# Patient Record
Sex: Male | Born: 1990 | Race: Black or African American | Hispanic: No | Marital: Single | Smoking: Current some day smoker
Health system: Southern US, Community
[De-identification: ages and names within clinical notes are randomized; demographics above are authoritative.]

---
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
## 2008-04-08 ENCOUNTER — Emergency Department (HOSPITAL_COMMUNITY): Admission: EM | Admit: 2008-04-08 | Discharge: 2008-04-08 | Payer: Self-pay | Admitting: Emergency Medicine

## 2010-05-23 ENCOUNTER — Emergency Department (HOSPITAL_BASED_OUTPATIENT_CLINIC_OR_DEPARTMENT_OTHER)
Admission: EM | Admit: 2010-05-23 | Discharge: 2010-05-23 | Payer: Self-pay | Source: Home / Self Care | Admitting: Emergency Medicine

## 2010-05-24 LAB — POCT TOXICOLOGY PANEL: Tetrahydrocannabinol: POSITIVE

## 2010-06-17 ENCOUNTER — Emergency Department (HOSPITAL_COMMUNITY): Payer: Self-pay

## 2010-06-17 ENCOUNTER — Emergency Department (HOSPITAL_COMMUNITY)
Admission: EM | Admit: 2010-06-17 | Discharge: 2010-06-17 | Disposition: A | Discharge status: Home / Self Care | Payer: Self-pay | Attending: Emergency Medicine | Admitting: Emergency Medicine

## 2010-06-17 DIAGNOSIS — R51 Headache: Secondary | ICD-10-CM | POA: Insufficient documentation

## 2010-06-17 LAB — CK TOTAL AND CKMB (NOT AT ARMC)
CK, MB: 0.5 ng/mL (ref 0.3–4.0)
Relative Index: 0.3 (ref 0.0–2.5)
Total CK: 168 U/L (ref 7–232)

## 2010-06-17 LAB — CBC
HCT: 37 % — ABNORMAL LOW (ref 39.0–52.0)
Hemoglobin: 13 g/dL (ref 13.0–17.0)
MCH: 32.2 pg (ref 26.0–34.0)
MCHC: 35.1 g/dL (ref 30.0–36.0)
MCV: 91.6 fL (ref 78.0–100.0)
Platelets: 159 10*3/uL (ref 150–400)
RBC: 4.04 MIL/uL — ABNORMAL LOW (ref 4.22–5.81)
RDW: 12.6 % (ref 11.5–15.5)
WBC: 8.5 10*3/uL (ref 4.0–10.5)

## 2010-06-17 LAB — COMPREHENSIVE METABOLIC PANEL
ALT: 10 U/L (ref 0–53)
AST: 16 U/L (ref 0–37)
Albumin: 4.2 g/dL (ref 3.5–5.2)
Alkaline Phosphatase: 69 U/L (ref 39–117)
BUN: 11 mg/dL (ref 6–23)
CO2: 27 mEq/L (ref 19–32)
Calcium: 9.5 mg/dL (ref 8.4–10.5)
Chloride: 106 mEq/L (ref 96–112)
Creatinine, Ser: 0.89 mg/dL (ref 0.4–1.5)
GFR calc Af Amer: >60 mL/min (ref >60)
GFR calc non Af Amer: >60 mL/min (ref >60)
Glucose, Bld: 106 mg/dL — ABNORMAL HIGH (ref 70–99)
Potassium: 3.8 mEq/L (ref 3.5–5.1)
Sodium: 139 mEq/L (ref 135–145)
Total Bilirubin: 0.5 mg/dL (ref 0.3–1.2)
Total Protein: 7.4 g/dL (ref 6.0–8.3)

## 2010-06-17 LAB — DIFFERENTIAL
Basophils Absolute: 0 10*3/uL (ref 0.0–0.1)
Basophils Relative: 0 % (ref 0–1)
Eosinophils Absolute: 0 10*3/uL (ref 0.0–0.7)
Eosinophils Relative: 0 % (ref 0–5)
Lymphocytes Relative: 25 % (ref 12–46)
Lymphs Abs: 2.1 10*3/uL (ref 0.7–4.0)
Monocytes Absolute: 0.7 10*3/uL (ref 0.1–1.0)
Monocytes Relative: 8 % (ref 3–12)
Neutro Abs: 5.7 10*3/uL (ref 1.7–7.7)
Neutrophils Relative %: 67 % (ref 43–77)

## 2010-07-21 ENCOUNTER — Emergency Department (HOSPITAL_COMMUNITY)
Admission: EM | Admit: 2010-07-21 | Discharge: 2010-07-21 | Disposition: A | Discharge status: Home / Self Care | Payer: Self-pay | Attending: Emergency Medicine | Admitting: Emergency Medicine

## 2010-07-21 DIAGNOSIS — F29 Unspecified psychosis not due to a substance or known physiological condition: Secondary | ICD-10-CM | POA: Insufficient documentation

## 2010-07-21 LAB — COMPREHENSIVE METABOLIC PANEL
ALT: 17 U/L (ref 0–53)
AST: 20 U/L (ref 0–37)
Albumin: 3.9 g/dL (ref 3.5–5.2)
Alkaline Phosphatase: 55 U/L (ref 39–117)
BUN: 9 mg/dL (ref 6–23)
CO2: 26 mEq/L (ref 19–32)
Calcium: 9.3 mg/dL (ref 8.4–10.5)
Chloride: 106 mEq/L (ref 96–112)
Creatinine, Ser: 0.97 mg/dL (ref 0.4–1.5)
GFR calc Af Amer: >60 mL/min (ref >60)
GFR calc non Af Amer: >60 mL/min (ref >60)
Glucose, Bld: 114 mg/dL — ABNORMAL HIGH (ref 70–99)
Potassium: 3.4 mEq/L — ABNORMAL LOW (ref 3.5–5.1)
Sodium: 137 mEq/L (ref 135–145)
Total Bilirubin: 0.7 mg/dL (ref 0.3–1.2)
Total Protein: 7 g/dL (ref 6.0–8.3)

## 2010-07-21 LAB — DIFFERENTIAL
Basophils Absolute: 0 10*3/uL (ref 0.0–0.1)
Basophils Relative: 0 % (ref 0–1)
Eosinophils Absolute: 0 10*3/uL (ref 0.0–0.7)
Eosinophils Relative: 0 % (ref 0–5)
Lymphocytes Relative: 27 % (ref 12–46)
Lymphs Abs: 2 10*3/uL (ref 0.7–4.0)
Monocytes Absolute: 0.4 10*3/uL (ref 0.1–1.0)
Monocytes Relative: 5 % (ref 3–12)
Neutro Abs: 5.1 10*3/uL (ref 1.7–7.7)
Neutrophils Relative %: 68 % (ref 43–77)

## 2010-07-21 LAB — CBC
HCT: 36.9 % — ABNORMAL LOW (ref 39.0–52.0)
Hemoglobin: 12.5 g/dL — ABNORMAL LOW (ref 13.0–17.0)
MCH: 31.2 pg (ref 26.0–34.0)
MCHC: 33.9 g/dL (ref 30.0–36.0)
MCV: 92 fL (ref 78.0–100.0)
Platelets: 169 10*3/uL (ref 150–400)
RBC: 4.01 MIL/uL — ABNORMAL LOW (ref 4.22–5.81)
RDW: 13.1 % (ref 11.5–15.5)
WBC: 7.5 10*3/uL (ref 4.0–10.5)

## 2010-07-21 LAB — ACETAMINOPHEN LEVEL: Acetaminophen (Tylenol), Serum: <10 ug/mL — ABNORMAL LOW (ref 10–30)

## 2010-07-21 LAB — RAPID URINE DRUG SCREEN, HOSP PERFORMED
Amphetamines: NOT DETECTED
Barbiturates: NOT DETECTED
Benzodiazepines: NOT DETECTED
Cocaine: NOT DETECTED
Opiates: NOT DETECTED
Tetrahydrocannabinol: NOT DETECTED

## 2010-07-21 LAB — ETHANOL: Alcohol, Ethyl (B): <5 mg/dL (ref 0–10)

## 2010-09-26 ENCOUNTER — Emergency Department (HOSPITAL_COMMUNITY): Payer: Self-pay

## 2010-09-26 ENCOUNTER — Emergency Department (HOSPITAL_COMMUNITY)
Admission: EM | Admit: 2010-09-26 | Discharge: 2010-09-26 | Disposition: A | Discharge status: Home / Self Care | Payer: Self-pay | Attending: Emergency Medicine | Admitting: Emergency Medicine

## 2010-09-26 DIAGNOSIS — S0003XA Contusion of scalp, initial encounter: Secondary | ICD-10-CM | POA: Insufficient documentation

## 2010-09-26 DIAGNOSIS — H5789 Other specified disorders of eye and adnexa: Secondary | ICD-10-CM | POA: Insufficient documentation

## 2010-09-26 DIAGNOSIS — S0083XA Contusion of other part of head, initial encounter: Secondary | ICD-10-CM | POA: Insufficient documentation

## 2011-02-11 LAB — URINALYSIS, ROUTINE W REFLEX MICROSCOPIC
Bilirubin Urine: NEGATIVE
Glucose, UA: NEGATIVE mg/dL
Hgb urine dipstick: NEGATIVE
Ketones, ur: NEGATIVE mg/dL
Nitrite: NEGATIVE
Protein, ur: NEGATIVE mg/dL
Specific Gravity, Urine: 1.023 (ref 1.005–1.030)
Urobilinogen, UA: 1 mg/dL (ref 0.0–1.0)
pH: 6.5 (ref 5.0–8.0)

## 2011-02-11 LAB — POCT I-STAT, CHEM 8
BUN: 20 mg/dL (ref 6–23)
Calcium, Ion: 1.22 mmol/L (ref 1.12–1.32)
Chloride: 104 mEq/L (ref 96–112)
Creatinine, Ser: 1.2 mg/dL (ref 0.4–1.5)
Glucose, Bld: 98 mg/dL (ref 70–99)
HCT: 41 % (ref 39.0–52.0)
Hemoglobin: 13.9 g/dL (ref 13.0–17.0)
Potassium: 4.5 mEq/L (ref 3.5–5.1)
Sodium: 141 mEq/L (ref 135–145)
TCO2: 31 mmol/L (ref 0–100)

## 2011-02-11 LAB — DIFFERENTIAL
Basophils Absolute: 0 10*3/uL (ref 0.0–0.1)
Basophils Relative: 1 % (ref 0–1)
Eosinophils Absolute: 0.1 10*3/uL (ref 0.0–0.7)
Eosinophils Relative: 2 % (ref 0–5)
Lymphocytes Relative: 33 % (ref 12–46)
Lymphs Abs: 1.8 10*3/uL (ref 0.7–4.0)
Monocytes Absolute: 0.4 10*3/uL (ref 0.1–1.0)
Monocytes Relative: 7 % (ref 3–12)
Neutro Abs: 3.3 10*3/uL (ref 1.7–7.7)
Neutrophils Relative %: 59 % (ref 43–77)

## 2011-02-11 LAB — POCT CARDIAC MARKERS
CKMB, poc: <1 ng/mL — ABNORMAL LOW (ref 1.0–8.0)
Myoglobin, poc: 56.3 ng/mL (ref 12–200)
Troponin i, poc: <0.05 ng/mL (ref 0.00–0.09)

## 2011-02-11 LAB — CBC
HCT: 39.6 % (ref 39.0–52.0)
Hemoglobin: 13.3 g/dL (ref 13.0–17.0)
MCHC: 33.7 g/dL (ref 30.0–36.0)
MCV: 94.5 fL (ref 78.0–100.0)
Platelets: 158 10*3/uL (ref 150–400)
RBC: 4.19 MIL/uL — ABNORMAL LOW (ref 4.22–5.81)
RDW: 13.4 % (ref 11.5–15.5)
WBC: 5.7 10*3/uL (ref 4.0–10.5)

## 2011-02-11 LAB — RAPID URINE DRUG SCREEN, HOSP PERFORMED
Amphetamines: NOT DETECTED
Barbiturates: NOT DETECTED
Benzodiazepines: NOT DETECTED
Cocaine: NOT DETECTED
Opiates: NOT DETECTED
Tetrahydrocannabinol: POSITIVE — AB
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
## 2013-02-06 ENCOUNTER — Emergency Department (HOSPITAL_COMMUNITY)
Admission: EM | Admit: 2013-02-06 | Discharge: 2013-02-06 | Disposition: A | Discharge status: Home / Self Care | Payer: Self-pay | Attending: Emergency Medicine | Admitting: Emergency Medicine

## 2013-02-06 ENCOUNTER — Encounter (HOSPITAL_COMMUNITY): Payer: Self-pay

## 2013-02-06 DIAGNOSIS — R21 Rash and other nonspecific skin eruption: Secondary | ICD-10-CM | POA: Insufficient documentation

## 2013-02-06 MED ORDER — CARRINGTON MOISTURE BARRIER EX CREA: TOPICAL_CREAM | CUTANEOUS | Status: AC | PRN

## 2013-02-06 MED ORDER — DEXAMETHASONE SODIUM PHOSPHATE 10 MG/ML IJ SOLN
10.0000 mg | Freq: Once | INTRAMUSCULAR | Status: AC
  Administered 2013-02-06: 10 mg via INTRAVENOUS
  Filled 2013-02-06: qty 1

## 2013-02-06 NOTE — ED Provider Notes (Signed)
CSN: [id number]     Arrival date & time 02/06/13  2440 History   First MD Initiated Contact with Patient 02/06/13 [phone]     No chief complaint on file.  (Consider location/radiation/quality/duration/timing/severity/associated sxs/prior Treatment) HPI Comments: Patient presents to the emergency department with chief complaint of rash. He states that he noticed the rash couple weeks ago around his waistline. He states that he applied hydrocortisone cream, and it got better. He states that it has recently worsened, and has spread to the extensor surfaces of his elbows and knees. He states that his mother and brother have a history of eczema. He denies any history of eczema personally, or asthma. Patient has tried using calamine lotion and old male baths with some relief. He denies any fever, chills, myalgias, arthralgias, nausea, or vomiting.  The history is provided by the patient. No language interpreter was used.    No past medical history on file. No past surgical history on file. No family history on file. History  Substance Use Topics  . Smoking status: Not on file  . Smokeless tobacco: Not on file  . Alcohol Use: Not on file    Review of Systems  All other systems reviewed and are negative.    Allergies  Review of patient's allergies indicates no known allergies.  Home Medications   Current Outpatient Rx  Name  Route  Sig  Dispense  Refill  . diphenhydramine-acetaminophen (TYLENOL PM) 25-500 MG TABS   Oral   Take 2 tablets by mouth at bedtime as needed (pain / sleep).         . Skin Protectants, Misc. (EUCERIN) cream   Topical   Apply topically as needed for wound care.   397 g   0    BP 137/94  Pulse 57  Temp(Src) 98.4 F (36.9 C) (Oral)  Resp 16  SpO2 100% Physical Exam  Nursing note and vitals reviewed. Constitutional: He is oriented to person, place, and time. He appears well-developed and well-nourished.  HENT:  Head: Normocephalic and atraumatic.   Eyes: Conjunctivae and EOM are normal.  Neck: Normal range of motion.  Cardiovascular: Normal rate.   Pulmonary/Chest: Effort normal.  Abdominal: He exhibits no distension.  Musculoskeletal: Normal range of motion.  Neurological: He is alert and oriented to person, place, and time.  Skin: Skin is dry.  Dry, scaly, maculopapular rash to the extensor surfaces of the elbows and knees, also with some mild involvement the abdomen, no bull's-eye or target lesions, no signs of SJS,   Psychiatric: He has a normal mood and affect. His behavior is normal. Judgment and thought content normal.    ED Course  Procedures (including critical care time) Labs Review Labs Reviewed - No data to display Imaging Review No results found.  MDM   1. Rash    Patient with a rash, likely eczema. Will treat with IM Decadron, and Eucerin. No fevers or chills. No recent tick bites or bug bites. No new contact exposures. Family history of eczema. Patient is not in apparent distress.  Recommend PCP follow-up.    Roxy Horseman, PA-C 02/06/13 1035

## 2013-02-06 NOTE — ED Provider Notes (Signed)
Medical screening examination/treatment/procedure(s) were performed by non-physician practitioner and as supervising physician I was immediately available for consultation/collaboration.  Ericberto Padget R. Silvia Markuson, MD 02/06/13 1612 

## 2013-02-06 NOTE — ED Notes (Signed)
Pt c/o rash to elbows and abdomen x3wks

## 2013-02-06 NOTE — Progress Notes (Signed)
[facility] did not get to see patient but will be sending information about the [facility] [employer] Card program, using the address provided.  °

## 2018-02-01 ENCOUNTER — Other Ambulatory Visit: Payer: Self-pay

## 2018-02-01 ENCOUNTER — Encounter (HOSPITAL_COMMUNITY): Payer: Self-pay | Admitting: *Deleted

## 2018-02-01 DIAGNOSIS — Z23 Encounter for immunization: Secondary | ICD-10-CM | POA: Insufficient documentation

## 2018-02-01 DIAGNOSIS — F17210 Nicotine dependence, cigarettes, uncomplicated: Secondary | ICD-10-CM | POA: Insufficient documentation

## 2018-02-01 DIAGNOSIS — W260XXA Contact with knife, initial encounter: Secondary | ICD-10-CM | POA: Insufficient documentation

## 2018-02-01 DIAGNOSIS — Y990 Civilian activity done for income or pay: Secondary | ICD-10-CM | POA: Diagnosis not present

## 2018-02-01 DIAGNOSIS — Y9289 Other specified places as the place of occurrence of the external cause: Secondary | ICD-10-CM | POA: Insufficient documentation

## 2018-02-01 DIAGNOSIS — S61512A Laceration without foreign body of left wrist, initial encounter: Secondary | ICD-10-CM | POA: Insufficient documentation

## 2018-02-01 DIAGNOSIS — Y9389 Activity, other specified: Secondary | ICD-10-CM | POA: Diagnosis not present

## 2018-02-01 NOTE — ED Triage Notes (Signed)
Pt arrives ambulatory to triage, says he cut his wrist at work with a box cutter. Lac to the wrist, bleeding controlled at this time. Aware to notify staff if bleeding restarts. Unknown tetanus.

## 2018-02-02 ENCOUNTER — Emergency Department (HOSPITAL_COMMUNITY)
Admission: EM | Admit: 2018-02-02 | Discharge: 2018-02-02 | Disposition: A | Discharge status: Home / Self Care | Payer: No Typology Code available for payment source | Attending: Emergency Medicine | Admitting: Emergency Medicine

## 2018-02-02 DIAGNOSIS — S61512A Laceration without foreign body of left wrist, initial encounter: Secondary | ICD-10-CM

## 2018-02-02 MED ORDER — LIDOCAINE-EPINEPHRINE (PF) 2 %-1:200000 IJ SOLN
10.0000 mL | Freq: Once | INTRAMUSCULAR | Status: AC
  Administered 2018-02-02: 10 mL via INTRADERMAL
  Filled 2018-02-02: qty 20

## 2018-02-02 MED ORDER — TETANUS-DIPHTH-ACELL PERTUSSIS 5-2.5-18.5 LF-MCG/0.5 IM SUSP
0.5000 mL | Freq: Once | INTRAMUSCULAR | Status: AC
  Administered 2018-02-02: 0.5 mL via INTRAMUSCULAR
  Filled 2018-02-02: qty 0.5

## 2018-02-02 NOTE — Discharge Instructions (Signed)
We recommend that you apply topical bacitracin or Neosporin 1-2 times per day to prevent infection.  You may take ibuprofen as needed for pain.  Have your stitches removed in 10 to 12 days, but not before 02/12/18.  Avoid soaking your wound in water such as while bathing or swimming.  You may shower normally.  Return sooner for new or concerning symptoms such as if signs of infection develop.

## 2018-02-02 NOTE — ED Provider Notes (Signed)
[facility]-EMERGENCY DEPT Provider Note   CSN: [id number] Arrival date & time: 02/01/18  2119     History   Chief Complaint Chief Complaint  Patient presents with  . Laceration    HPI Richard Guido Comp. is a 27 y.o. male.  27 year old male presents to the emergency department for evaluation of a laceration to his volar left forearm.  Symptoms onset was around 2000 tonight while using a box cutter at work.  Reports some pain to the area which has been unchanged.  Applied pressure prior to arrival with improvement to bleeding.  Has had no numbness, tingling, weakness.  Is unaware of the date of his last tetanus shot.  No medications taken prior to arrival.    History reviewed. No pertinent past medical history.  There are no active problems to display for this patient.   History reviewed. No pertinent surgical history.      Home Medications    Prior to Admission medications   Medication Sig Start Date End Date Taking? Authorizing Provider  diphenhydramine-acetaminophen (TYLENOL PM) 25-500 MG TABS Take 2 tablets by mouth at bedtime as needed (pain / sleep).    [provider]  Skin Protectants, Misc. (EUCERIN) cream Apply topically as needed for wound care. 02/06/13   Roxy Horseman, PA-C    Family History No family history on file.  Social History Social History   Tobacco Use  . Smoking status: Current Some Day Smoker  Substance Use Topics  . Alcohol use: Yes  . Drug use: Never     Allergies   Patient has no known allergies.   Review of Systems Review of Systems Ten systems reviewed and are negative for acute change, except as noted in the HPI.    Physical Exam Updated Vital Signs BP (!) 130/93 (BP Location: Right Arm)   Pulse 78   Temp 98.4 F (36.9 C) (Oral)   Resp 15   Ht 5\' 7"  (1.702 m)   Wt 81.6 kg   SpO2 100%   BMI 28.19 kg/m   Physical Exam  Constitutional: He is oriented to person, place, and time. He  appears well-developed and well-nourished. No distress.  Nontoxic appearing and in NAD  HENT:  Head: Normocephalic and atraumatic.  Eyes: Conjunctivae and EOM are normal. No scleral icterus.  Neck: Normal range of motion.  Cardiovascular: Normal rate, regular rhythm and intact distal pulses.  Distal radial pulse 2+ in the LUE  Pulmonary/Chest: Effort normal. No respiratory distress.  Respirations even and unlabored  Musculoskeletal: Normal range of motion.       Left wrist: He exhibits laceration.  Neurological: He is alert and oriented to person, place, and time. He exhibits normal muscle tone. Coordination normal.  Sensation to light touch intact in the LUE  Skin: Skin is warm and dry. No rash noted. He is not diaphoretic. No erythema. No pallor.  5cm laceration to the volar aspect of the L forearm. Bleeding controlled.  Psychiatric: He has a normal mood and affect. His behavior is normal.  Nursing note and vitals reviewed.    ED Treatments / Results  Labs (all labs ordered are listed, but only abnormal results are displayed) Labs Reviewed - No data to display  EKG None  Radiology No results found.  Procedures Procedures (including critical care time)   LACERATION REPAIR Performed by: Antony Madura Authorized by: Antony Madura Consent: Verbal consent obtained. Risks and benefits: risks, benefits and alternatives were discussed Consent given by: patient  Patient identity confirmed: provided demographic data Prepped and Draped in normal sterile fashion Wound explored  Laceration Location: volar L forearm  Laceration Length: 5cm  No Foreign Bodies seen or palpated  Anesthesia: local infiltration  Local anesthetic: lidocaine 1% with epinephrine  Anesthetic total: 5 ml  Irrigation method: syringe Amount of cleaning: standard  Skin closure: 4-0 ethilon  Number of sutures: 6  Technique: simple interrupted  Patient tolerance: Patient tolerated the procedure  well with no immediate complications.   Medications Ordered in ED Medications  lidocaine-EPINEPHrine (XYLOCAINE W/EPI) 2 %-1:200000 (PF) injection 10 mL (10 mLs Intradermal Given 02/02/18 0425)  Tdap (BOOSTRIX) injection 0.5 mL (0.5 mLs Intramuscular Given 02/02/18 0518)     Initial Impression / Assessment and Plan / ED Course  I have reviewed the triage vital signs and the nursing notes.  Pertinent labs & imaging results that were available during my care of the patient were reviewed by me and considered in my medical decision making (see chart for details).     Tdap booster given. Pressure irrigation performed. Laceration occurred < 8 hours prior to repair which was well tolerated. Patient has no comorbidities to effect normal wound healing. Discussed suture home care with patient and answered questions. Patient to follow up for wound check and suture removal in 10-12 days. Return precautions discussed and provided. Patient discharged in stable condition with no unaddressed concerns.   Final Clinical Impressions(s) / ED Diagnoses   Final diagnoses:  Wrist laceration, left, initial encounter    ED Discharge Orders    None       Antony Madura, PA-C 02/02/18 0529    Palumbo, April, MD 02/02/18 [phone]
